# Patient Record
Sex: Male | Born: 1980 | Race: White | Hispanic: No | Marital: Single | Smoking: Never smoker
Health system: Southern US, Community
[De-identification: ages and names within clinical notes are randomized; demographics above are authoritative.]

## PROBLEM LIST (undated history)

## (undated) DIAGNOSIS — J45909 Unspecified asthma, uncomplicated: Secondary | ICD-10-CM

## (undated) HISTORY — PX: HERNIA REPAIR: SHX51

## (undated) HISTORY — PX: TONSILLECTOMY: SUR1361

---

## 2008-10-06 ENCOUNTER — Emergency Department (HOSPITAL_BASED_OUTPATIENT_CLINIC_OR_DEPARTMENT_OTHER): Admission: EM | Admit: 2008-10-06 | Discharge: 2008-10-06 | Payer: Self-pay | Admitting: Emergency Medicine

## 2008-10-07 ENCOUNTER — Emergency Department (HOSPITAL_BASED_OUTPATIENT_CLINIC_OR_DEPARTMENT_OTHER): Admission: EM | Admit: 2008-10-07 | Discharge: 2008-10-07 | Payer: Self-pay | Admitting: Emergency Medicine

## 2012-10-07 ENCOUNTER — Ambulatory Visit: Payer: Self-pay | Admitting: Physician Assistant

## 2013-03-30 ENCOUNTER — Ambulatory Visit: Payer: Self-pay

## 2013-03-30 LAB — RAPID INFLUENZA A&B ANTIGENS

## 2013-04-02 ENCOUNTER — Ambulatory Visit: Payer: Self-pay

## 2013-04-02 LAB — CBC WITH DIFFERENTIAL/PLATELET
Basophil %: 0.4 %
Eosinophil %: 7 %
HGB: 17.4 g/dL (ref 13.0–18.0)
Lymphocyte %: 31.1 %
MCHC: 33.3 g/dL (ref 32.0–36.0)
MCV: 86 fL (ref 80–100)
Monocyte #: 0.6 x10 3/mm (ref 0.2–1.0)
Monocyte %: 7.8 %
Platelet: 208 10*3/uL (ref 150–440)
WBC: 7.2 10*3/uL (ref 3.8–10.6)

## 2013-04-02 LAB — COMPREHENSIVE METABOLIC PANEL
Alkaline Phosphatase: 66 U/L (ref 50–136)
Anion Gap: 12 (ref 7–16)
BUN: 34 mg/dL — ABNORMAL HIGH (ref 7–18)
Bilirubin,Total: 0.5 mg/dL (ref 0.2–1.0)
Chloride: 102 mmol/L (ref 98–107)
Co2: 26 mmol/L (ref 21–32)
Creatinine: 1.28 mg/dL (ref 0.60–1.30)
EGFR (African American): >60
EGFR (Non-African Amer.): >60
Potassium: 4.4 mmol/L (ref 3.5–5.1)
SGOT(AST): 19 U/L (ref 15–37)
SGPT (ALT): 35 U/L (ref 12–78)
Sodium: 140 mmol/L (ref 136–145)

## 2013-04-02 LAB — MONONUCLEOSIS SCREEN: Mono Test: NEGATIVE

## 2014-09-29 IMAGING — CR DG CHEST 2V
1 series · 3 of 3 positions shown · non-contrast
Comparison: none

REASON FOR EXAM: wheezing
COMMENTS:

PROCEDURE:     MDR - MDR CHEST PA(OR AP) AND LATERAL  - April 02, 2013  [DATE]
RESULT:     The lungs are clear. The cardiac silhouette and visualized bony
skeleton are unremarkable.

[Series 1: pa · 0.17mm/px · 3 of 3 slices shown]
[im 1/3]
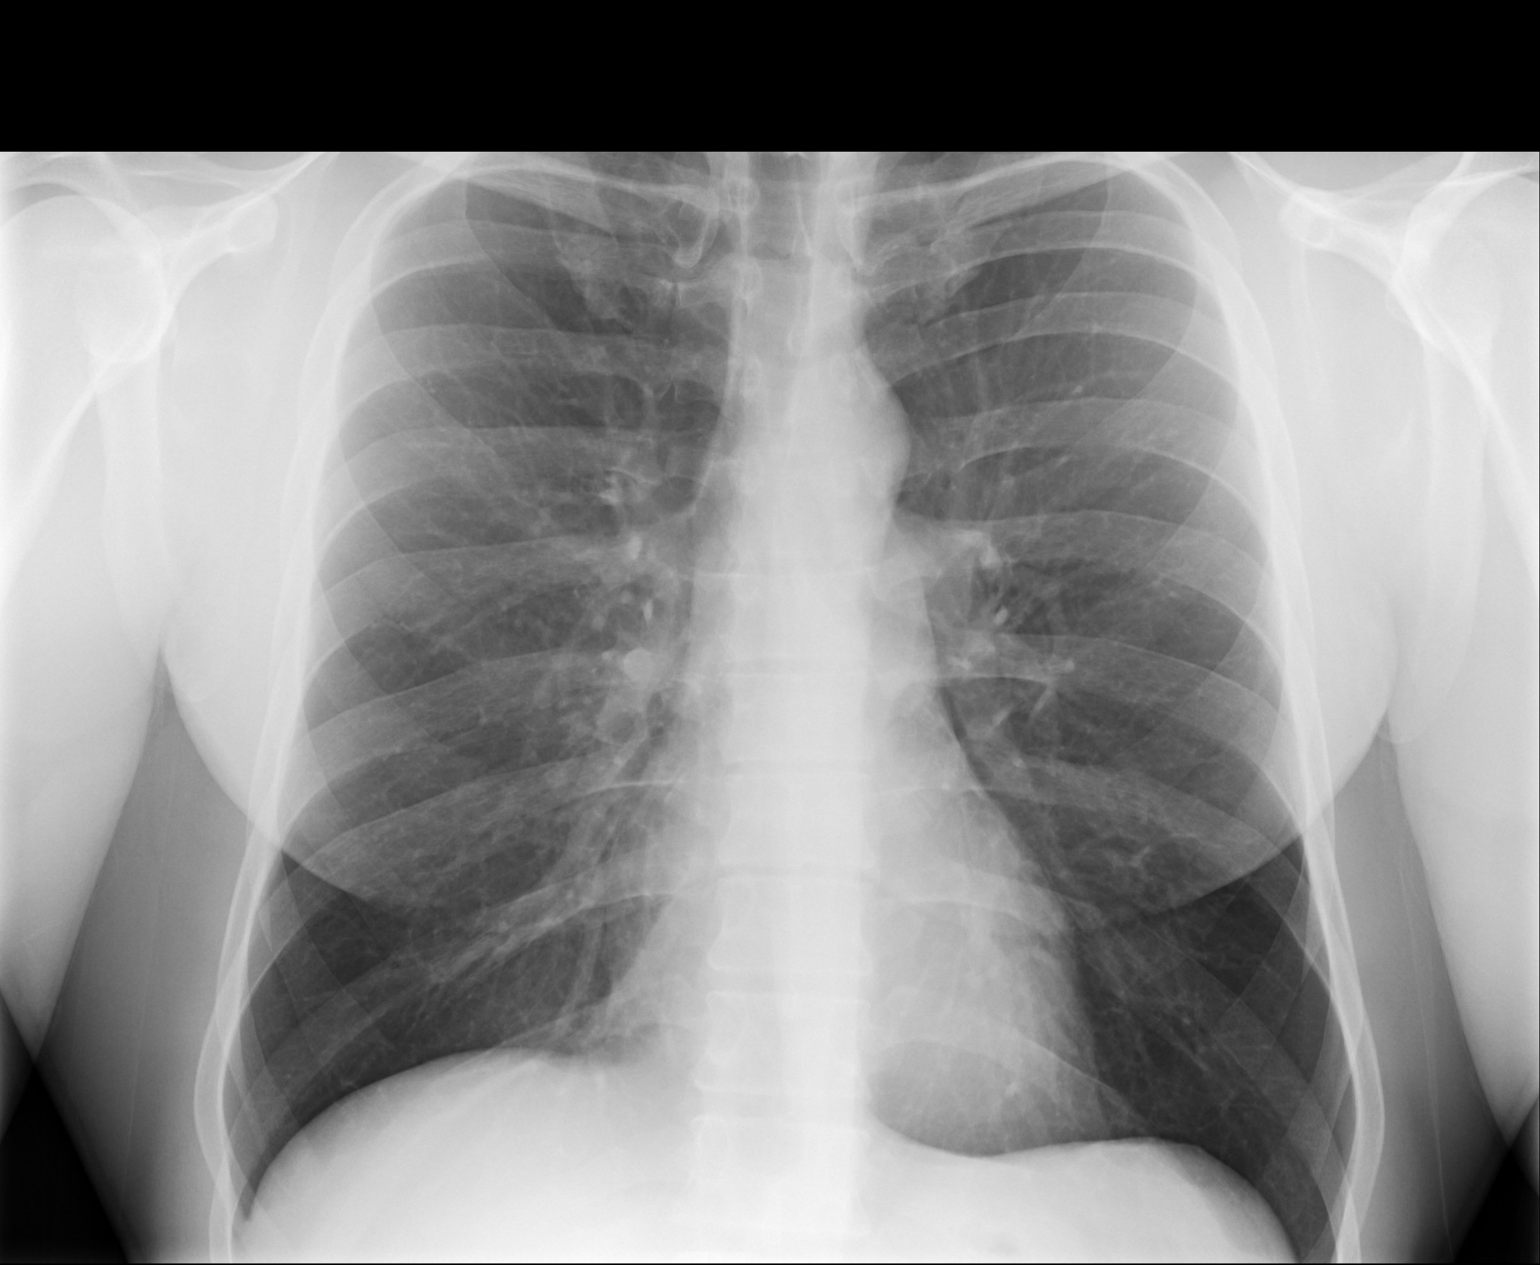
[im 2/3]
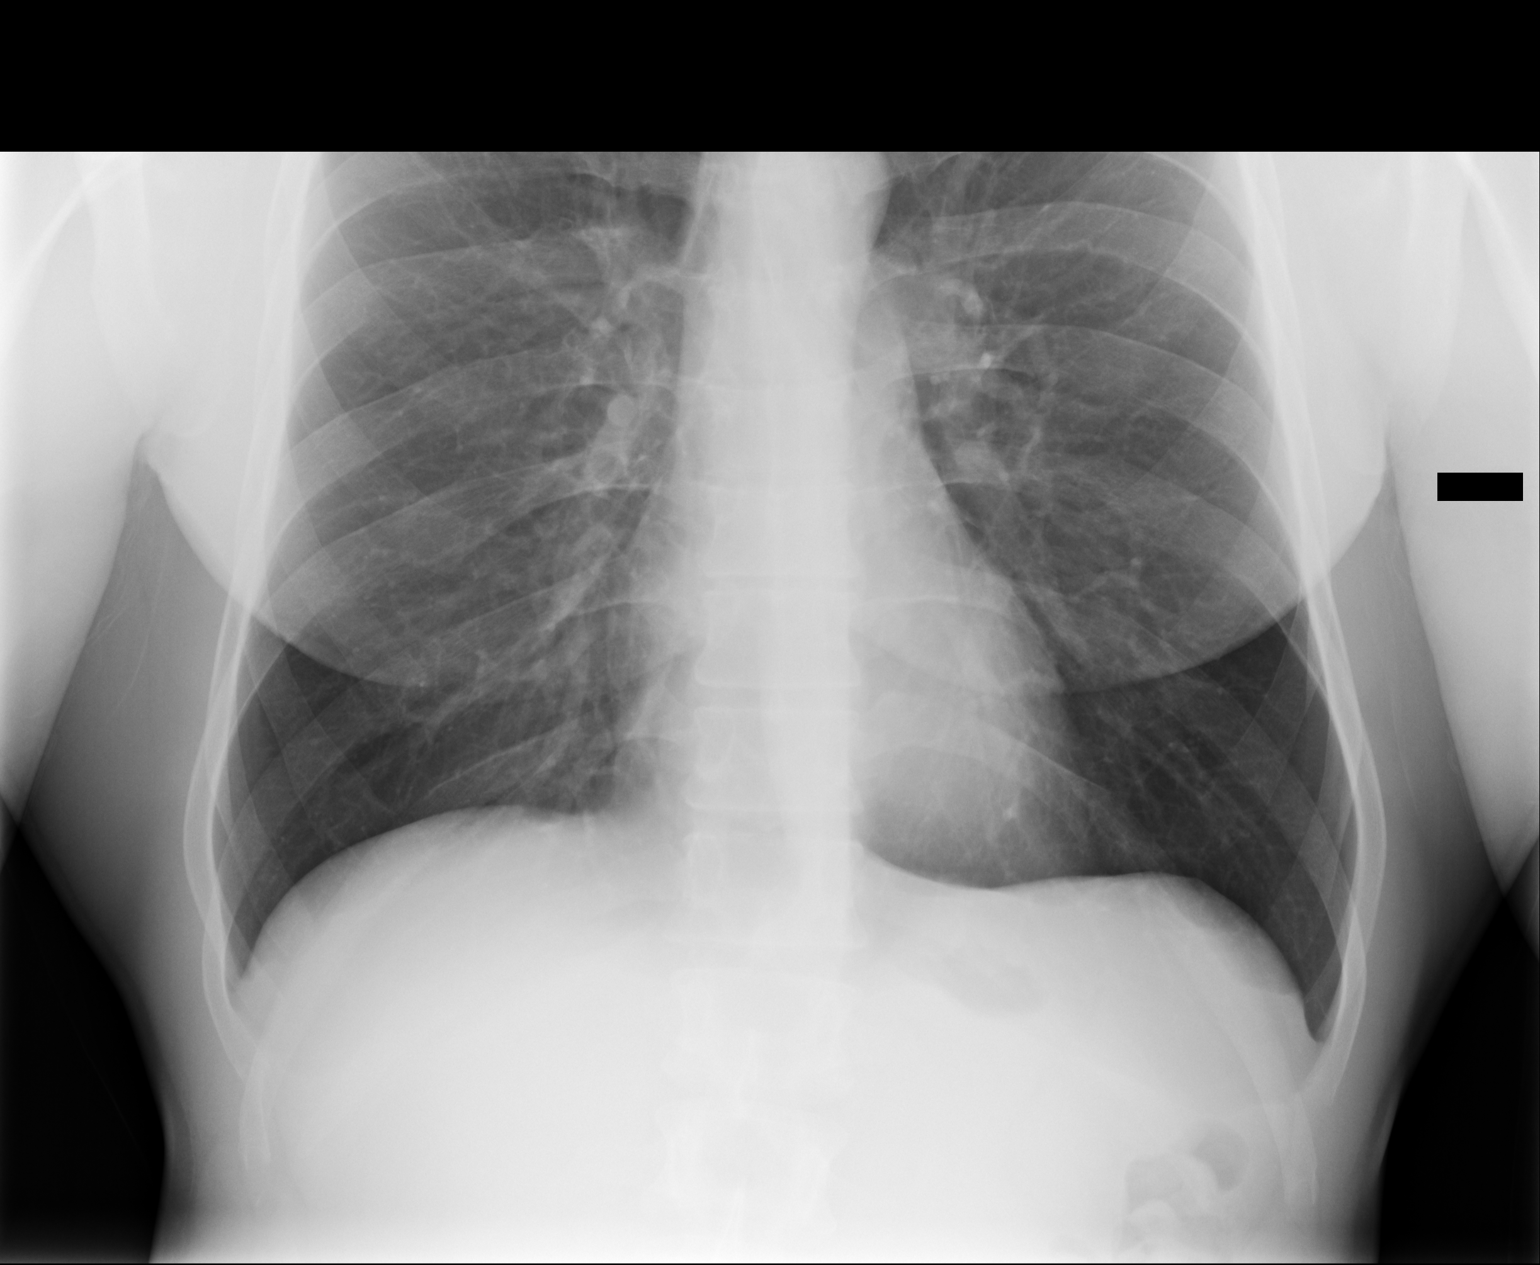
[im 3/3]
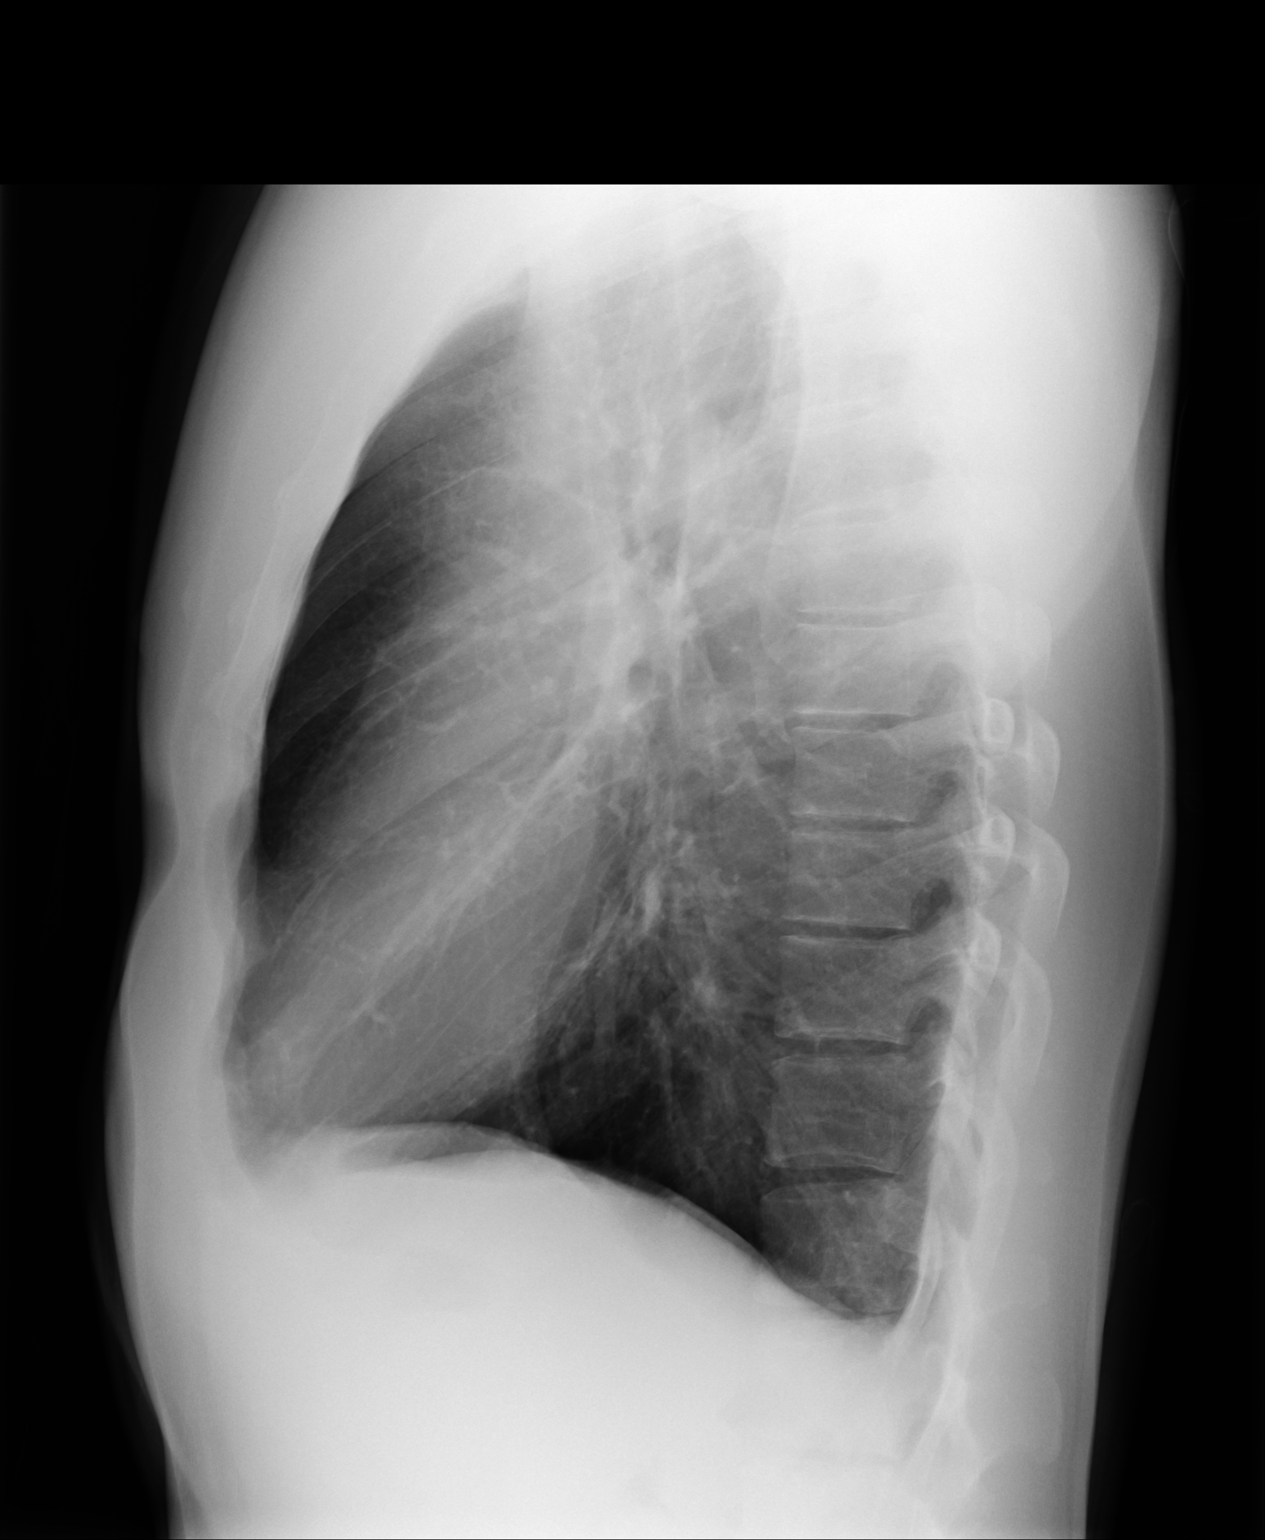

[3 of 3 positions shown; findings below may reference images not displayed]

IMPRESSION: 1. Chest radiograph without evidence of acute cardiopulmonary disease.
2. Comparison to prior dated 03/30/2013.

## 2019-02-07 ENCOUNTER — Other Ambulatory Visit: Payer: Self-pay

## 2019-02-07 ENCOUNTER — Emergency Department (HOSPITAL_BASED_OUTPATIENT_CLINIC_OR_DEPARTMENT_OTHER): Payer: 59

## 2019-02-07 ENCOUNTER — Encounter (HOSPITAL_BASED_OUTPATIENT_CLINIC_OR_DEPARTMENT_OTHER): Payer: Self-pay | Admitting: Emergency Medicine

## 2019-02-07 ENCOUNTER — Emergency Department (HOSPITAL_BASED_OUTPATIENT_CLINIC_OR_DEPARTMENT_OTHER)
Admission: EM | Admit: 2019-02-07 | Discharge: 2019-02-07 | Disposition: A | Discharge status: Home / Self Care | Payer: 59 | Attending: Emergency Medicine | Admitting: Emergency Medicine

## 2019-02-07 DIAGNOSIS — Z20822 Contact with and (suspected) exposure to covid-19: Secondary | ICD-10-CM

## 2019-02-07 DIAGNOSIS — Z20828 Contact with and (suspected) exposure to other viral communicable diseases: Secondary | ICD-10-CM | POA: Diagnosis not present

## 2019-02-07 DIAGNOSIS — M7918 Myalgia, other site: Secondary | ICD-10-CM | POA: Diagnosis not present

## 2019-02-07 DIAGNOSIS — R0602 Shortness of breath: Secondary | ICD-10-CM | POA: Diagnosis present

## 2019-02-07 DIAGNOSIS — J45909 Unspecified asthma, uncomplicated: Secondary | ICD-10-CM | POA: Diagnosis not present

## 2019-02-07 DIAGNOSIS — R52 Pain, unspecified: Secondary | ICD-10-CM

## 2019-02-07 HISTORY — DX: Unspecified asthma, uncomplicated: J45.909

## 2019-02-07 NOTE — ED Provider Notes (Signed)
[facility] EMERGENCY DEPARTMENT Provider Note   CSN: [id number] Arrival date & time: 02/07/19  0101     History   Chief Complaint Chief Complaint  Patient presents with  . Shortness of Breath    HPI Daniel Barrett is a 38 y.o. male.     The history is provided by the patient.  Shortness of Breath Severity:  Mild Onset quality:  Gradual Duration:  1 week Timing:  Constant Progression:  Unchanged Chronicity:  New Context: not URI   Context comment:  Was hanging out with 5 people that have COVID.   Relieved by:  Nothing Worsened by:  Nothing Ineffective treatments:  Inhaler Associated symptoms: no chest pain, no cough, no diaphoresis, no fever, no hemoptysis, no neck pain, no rash, no sore throat, no sputum production and no wheezing   Associated symptoms comment:  Body aches Risk factors: no hx of PE/DVT   Patient with no PMH or significant FMH who presents with sensation of SOB after spending time with multiple covid positive people.  No CP, no DOE, no leg pain or swelling.  No travel.  No anosmia or loss of taste but has spent time with multiple covid positive people.  He had a rapid test Monday that was negative.    Past Medical History:  Diagnosis Date  . Asthma     There are no active problems to display for this patient.    Home Medications    Prior to Admission medications   Not on File    Family History No family history on file.  Social History Social History   Tobacco Use  . Smoking status: Never Smoker  . Smokeless tobacco: Never Used  Substance Use Topics  . Alcohol use: Not Currently  . Drug use: Not on file     Allergies   Sulfa antibiotics   Review of Systems Review of Systems  Constitutional: Negative for diaphoresis and fever.  HENT: Negative for sore throat.   Eyes: Negative for visual disturbance.  Respiratory: Positive for shortness of breath. Negative for cough, hemoptysis, sputum production and wheezing.    Cardiovascular: Negative for chest pain, palpitations and leg swelling.  Genitourinary: Negative for dysuria.  Musculoskeletal: Positive for myalgias. Negative for arthralgias, joint swelling, neck pain and neck stiffness.  Skin: Negative for rash.  Neurological: Negative for numbness.  Psychiatric/Behavioral: The patient is nervous/anxious.   All other systems reviewed and are negative.    Physical Exam Updated Vital Signs BP (!) 161/76 (BP Location: Right Arm)   Pulse 69   Temp 98.3 F (36.8 C) (Oral)   Resp 16   Ht 5\' 11"  (1.803 m)   Wt 99.8 kg   SpO2 100%   BMI 30.68 kg/m   Physical Exam Vitals signs and nursing note reviewed.  Constitutional:      General: He is not in acute distress.    Appearance: He is normal weight.  HENT:     Head: Normocephalic and atraumatic.     Nose: Nose normal.  Eyes:     Conjunctiva/sclera: Conjunctivae normal.     Pupils: Pupils are equal, round, and reactive to light.  Neck:     Musculoskeletal: Normal range of motion and neck supple.  Cardiovascular:     Rate and Rhythm: Normal rate and regular rhythm.     Pulses: Normal pulses.     Heart sounds: Normal heart sounds.  Pulmonary:     Effort: Pulmonary effort is normal. No respiratory distress.  Breath sounds: Normal breath sounds. No wheezing or rales.  Abdominal:     General: Abdomen is flat. Bowel sounds are normal.     Tenderness: There is no abdominal tenderness. There is no guarding or rebound.  Musculoskeletal: Normal range of motion.        General: No swelling or tenderness.     Right lower leg: No edema.     Left lower leg: No edema.  Skin:    General: Skin is warm and dry.     Capillary Refill: Capillary refill takes less than 2 seconds.  Neurological:     General: No focal deficit present.     Mental Status: He is alert and oriented to person, place, and time.  Psychiatric:        Mood and Affect: Mood is anxious.      ED Treatments / Results  Labs (all  labs ordered are listed, but only abnormal results are displayed) Labs Reviewed  NOVEL CORONAVIRUS, NAA (HOSPITAL ORDER, SEND-OUT TO REF LAB)     Radiology No results found for this or any previous visit. Dg Chest Portable 1 View  Result Date: 02/07/2019 CLINICAL DATA:  Shortness of breath for several days EXAM: PORTABLE CHEST 1 VIEW COMPARISON:  04/02/2013 FINDINGS: Cardiac shadows within normal limits. The lungs are well aerated bilaterally. No focal infiltrate or sizable effusion is noted. No bony abnormality is seen. IMPRESSION: No acute abnormality noted. Electronically Signed   By: Alcide CleverMark  Lukens M.D.   On: 02/07/2019 02:04     Procedures Procedures (including critical care time)  Medications Ordered in ED Medications - No data to display   PERC negative wells 0 highly doubt PE in this low risk patient.  I think this is anxiety more than anything but I have repeated the covid test and placed the patient on STRICT home isolation until the test results in 10 days.  He is not to leave the house at all except for worsening shortness of breath weakness or chest pain.  He verbalizes understanding and agrees to quarantine.  Isolation paperwork has been signed.    Daniel Barrett was evaluated in Emergency Department on 02/07/2019 for the symptoms described in the history of present illness. He was evaluated in the context of the global COVID-19 pandemic, which necessitated consideration that the patient might be at risk for infection with the SARS-CoV-2 virus that causes COVID-19. Institutional protocols and algorithms that pertain to the evaluation of patients at risk for COVID-19 are in a state of rapid change based on information released by regulatory bodies including the CDC and federal and state organizations. These policies and algorithms were followed during the patient's care in the ED.   Final Clinical Impressions(s) / ED Diagnoses   Return for intractable cough, coughing up  blood,fevers >100.4 unrelieved by medication, shortness of breath, intractable vomiting, chest pain, shortness of breath, weakness,numbness, changes in speech, facial asymmetry,abdominal pain, passing out,Inability to tolerate liquids or food, cough, altered mental status or any concerns. No signs of systemic illness or infection. The patient is nontoxic-appearing on exam and vital signs are within normal limits.   I have reviewed the triage vital signs and the nursing notes. Pertinent labs &imaging results that were available during my care of the patient were reviewed by me and considered in my medical decision making (see chart for details).  After history, exam, and medical workup I feel the patient has been appropriately medically screened and is safe for discharge home. Pertinent  diagnoses were discussed with the patient. Patient was given return precautions   Renaldo Gornick, MD 02/07/19 1660

## 2019-02-07 NOTE — Discharge Instructions (Addendum)
Person Under Monitoring Name: Daniel Barrett  Location: [place] [address]   Infection Prevention Recommendations for Individuals Confirmed to have, or Being Evaluated for, 2019 Novel Coronavirus (COVID-19) Infection Who Receive Care at Home  Individuals who are confirmed to have, or are being evaluated for, COVID-19 should follow the prevention steps below until a healthcare provider or local or state health department says they can return to normal activities.  Stay home except to get medical care You should restrict activities outside your home, except for getting medical care. Do not go to work, school, or public areas, and do not use public transportation or taxis.  Call ahead before visiting your doctor Before your medical appointment, call the healthcare provider and tell them that you have, or are being evaluated for, COVID-19 infection. This will help the healthcare providers office take steps to keep other people from getting infected. Ask your healthcare provider to call the local or state health department.  Monitor your symptoms Seek prompt medical attention if your illness is worsening (e.g., difficulty breathing). Before going to your medical appointment, call the healthcare provider and tell them that you have, or are being evaluated for, COVID-19 infection. Ask your healthcare provider to call the local or state health department.  Wear a facemask You should wear a facemask that covers your nose and mouth when you are in the same room with other people and when you visit a healthcare provider. People who live with or visit you should also wear a facemask while they are in the same room with you.  Separate yourself from other people in your home As much as possible, you should stay in a different room from other people in your home. Also, you should use a separate bathroom, if available.  Avoid sharing household items You should not  share dishes, drinking glasses, cups, eating utensils, towels, bedding, or other items with other people in your home. After using these items, you should wash them thoroughly with soap and water.  Cover your coughs and sneezes Cover your mouth and nose with a tissue when you cough or sneeze, or you can cough or sneeze into your sleeve. Throw used tissues in a lined trash can, and immediately wash your hands with soap and water for at least 20 seconds or use an alcohol-based hand rub.  Wash your [facility] your hands often and thoroughly with soap and water for at least 20 seconds. You can use an alcohol-based hand sanitizer if soap and water are not available and if your hands are not visibly dirty. Avoid touching your eyes, nose, and mouth with unwashed hands.   Prevention Steps for Caregivers and Household Members of Individuals Confirmed to have, or Being Evaluated for, COVID-19 Infection Being Cared for in the Home  If you live with, or provide care at home for, a person confirmed to have, or being evaluated for, COVID-19 infection please follow these guidelines to prevent infection:  Follow healthcare providers instructions Make sure that you understand and can help the patient follow any healthcare provider instructions for all care.  Provide for the patients basic needs You should help the patient with basic needs in the home and provide support for getting groceries, prescriptions, and other personal needs.  Monitor the patients symptoms If they are getting sicker, call his or her medical provider and tell them that the patient has, or is being evaluated for, COVID-19 infection. This will help the healthcare providers office  take steps to keep other people from getting infected. Ask the healthcare provider to call the local or state health department.  Limit the number of people who have contact with the patient If possible, have only one caregiver for the  patient. Other household members should stay in another home or place of residence. If this is not possible, they should stay in another room, or be separated from the patient as much as possible. Use a separate bathroom, if available. Restrict visitors who do not have an essential need to be in the home.  Keep older adults, very young children, and other sick people away from the patient Keep older adults, very young children, and those who have compromised immune systems or chronic health conditions away from the patient. This includes people with chronic heart, lung, or kidney conditions, diabetes, and cancer.  Ensure good ventilation Make sure that shared spaces in the home have good air flow, such as from an air conditioner or an opened window, weather permitting.  Wash your hands often Wash your hands often and thoroughly with soap and water for at least 20 seconds. You can use an alcohol based hand sanitizer if soap and water are not available and if your hands are not visibly dirty. Avoid touching your eyes, nose, and mouth with unwashed hands. Use disposable paper towels to dry your hands. If not available, use dedicated cloth towels and replace them when they become wet.  Wear a facemask and gloves Wear a disposable facemask at all times in the room and gloves when you touch or have contact with the patients blood, body fluids, and/or secretions or excretions, such as sweat, saliva, sputum, nasal mucus, vomit, urine, or feces.  Ensure the mask fits over your nose and mouth tightly, and do not touch it during use. Throw out disposable facemasks and gloves after using them. Do not reuse. Wash your hands immediately after removing your facemask and gloves. If your personal clothing becomes contaminated, carefully remove clothing and launder. Wash your hands after handling contaminated clothing. Place all used disposable facemasks, gloves, and other waste in a lined container before  disposing them with other household waste. Remove gloves and wash your hands immediately after handling these items.  Do not share dishes, glasses, or other household items with the patient Avoid sharing household items. You should not share dishes, drinking glasses, cups, eating utensils, towels, bedding, or other items with a patient who is confirmed to have, or being evaluated for, COVID-19 infection. After the person uses these items, you should wash them thoroughly with soap and water.  Wash laundry thoroughly Immediately remove and wash clothes or bedding that have blood, body fluids, and/or secretions or excretions, such as sweat, saliva, sputum, nasal mucus, vomit, urine, or feces, on them. Wear gloves when handling laundry from the patient. Read and follow directions on labels of laundry or clothing items and detergent. In general, wash and dry with the warmest temperatures recommended on the label.  Clean all areas the individual has used often Clean all touchable surfaces, such as counters, tabletops, doorknobs, bathroom fixtures, toilets, phones, keyboards, tablets, and bedside tables, every day. Also, clean any surfaces that may have blood, body fluids, and/or secretions or excretions on them. Wear gloves when cleaning surfaces the patient has come in contact with. Use a diluted bleach solution (e.g., dilute bleach with 1 part bleach and 10 parts water) or a household disinfectant with a label that says EPA-registered for coronaviruses. To make a bleach  solution at home, add 1 tablespoon of bleach to 1 quart (4 cups) of water. For a larger supply, add  cup of bleach to 1 gallon (16 cups) of water. Read labels of cleaning products and follow recommendations provided on product labels. Labels contain instructions for safe and effective use of the cleaning product including precautions you should take when applying the product, such as wearing gloves or eye protection and making sure you  have good ventilation during use of the product. Remove gloves and wash hands immediately after cleaning.  Monitor yourself for signs and symptoms of illness Caregivers and household members are considered close contacts, should monitor their health, and will be asked to limit movement outside of the home to the extent possible. Follow the monitoring steps for close contacts listed on the symptom monitoring form.   ? If you have additional questions, contact your local health department or call the epidemiologist on call at [phone] (available 24/7). ? This guidance is subject to change. For the most up-to-date guidance from [facility], please refer to their website: [email or web address]

## 2019-02-07 NOTE — ED Triage Notes (Signed)
Pt reports shob x 2-3 days. Pt reports exposure to COVID on 01/26/2019. Pt tested negative for COVID this past mon.

## 2019-02-08 LAB — NOVEL CORONAVIRUS, NAA (HOSP ORDER, SEND-OUT TO REF LAB; TAT 18-24 HRS): SARS-CoV-2, NAA: NOT DETECTED

## 2019-10-30 ENCOUNTER — Encounter (HOSPITAL_BASED_OUTPATIENT_CLINIC_OR_DEPARTMENT_OTHER): Payer: Self-pay | Admitting: *Deleted

## 2019-10-30 ENCOUNTER — Other Ambulatory Visit: Payer: Self-pay

## 2019-10-30 ENCOUNTER — Emergency Department (HOSPITAL_BASED_OUTPATIENT_CLINIC_OR_DEPARTMENT_OTHER)
Admission: EM | Admit: 2019-10-30 | Discharge: 2019-10-30 | Disposition: A | Discharge status: Home / Self Care | Payer: Self-pay | Attending: Emergency Medicine | Admitting: Emergency Medicine

## 2019-10-30 ENCOUNTER — Emergency Department (HOSPITAL_BASED_OUTPATIENT_CLINIC_OR_DEPARTMENT_OTHER): Payer: Self-pay

## 2019-10-30 DIAGNOSIS — Z5321 Procedure and treatment not carried out due to patient leaving prior to being seen by health care provider: Secondary | ICD-10-CM | POA: Insufficient documentation

## 2019-10-30 DIAGNOSIS — M25511 Pain in right shoulder: Secondary | ICD-10-CM | POA: Insufficient documentation

## 2019-10-30 NOTE — ED Triage Notes (Signed)
PT reports right shoulder pain since last night while doing a bench press. States that his shoulder got hot, denies a 'pop'. Pulses intact. Inability to raise arm today. Tender on palpation of anterior shoulder.

## 2020-08-05 IMAGING — DX PORTABLE CHEST - 1 VIEW
2 series · 2 of 2 positions shown · non-contrast
Comparison: 04/02/2013

CLINICAL DATA: Shortness of breath for several days

EXAM:
PORTABLE CHEST 1 VIEW

[chest ap (1 of 2)]
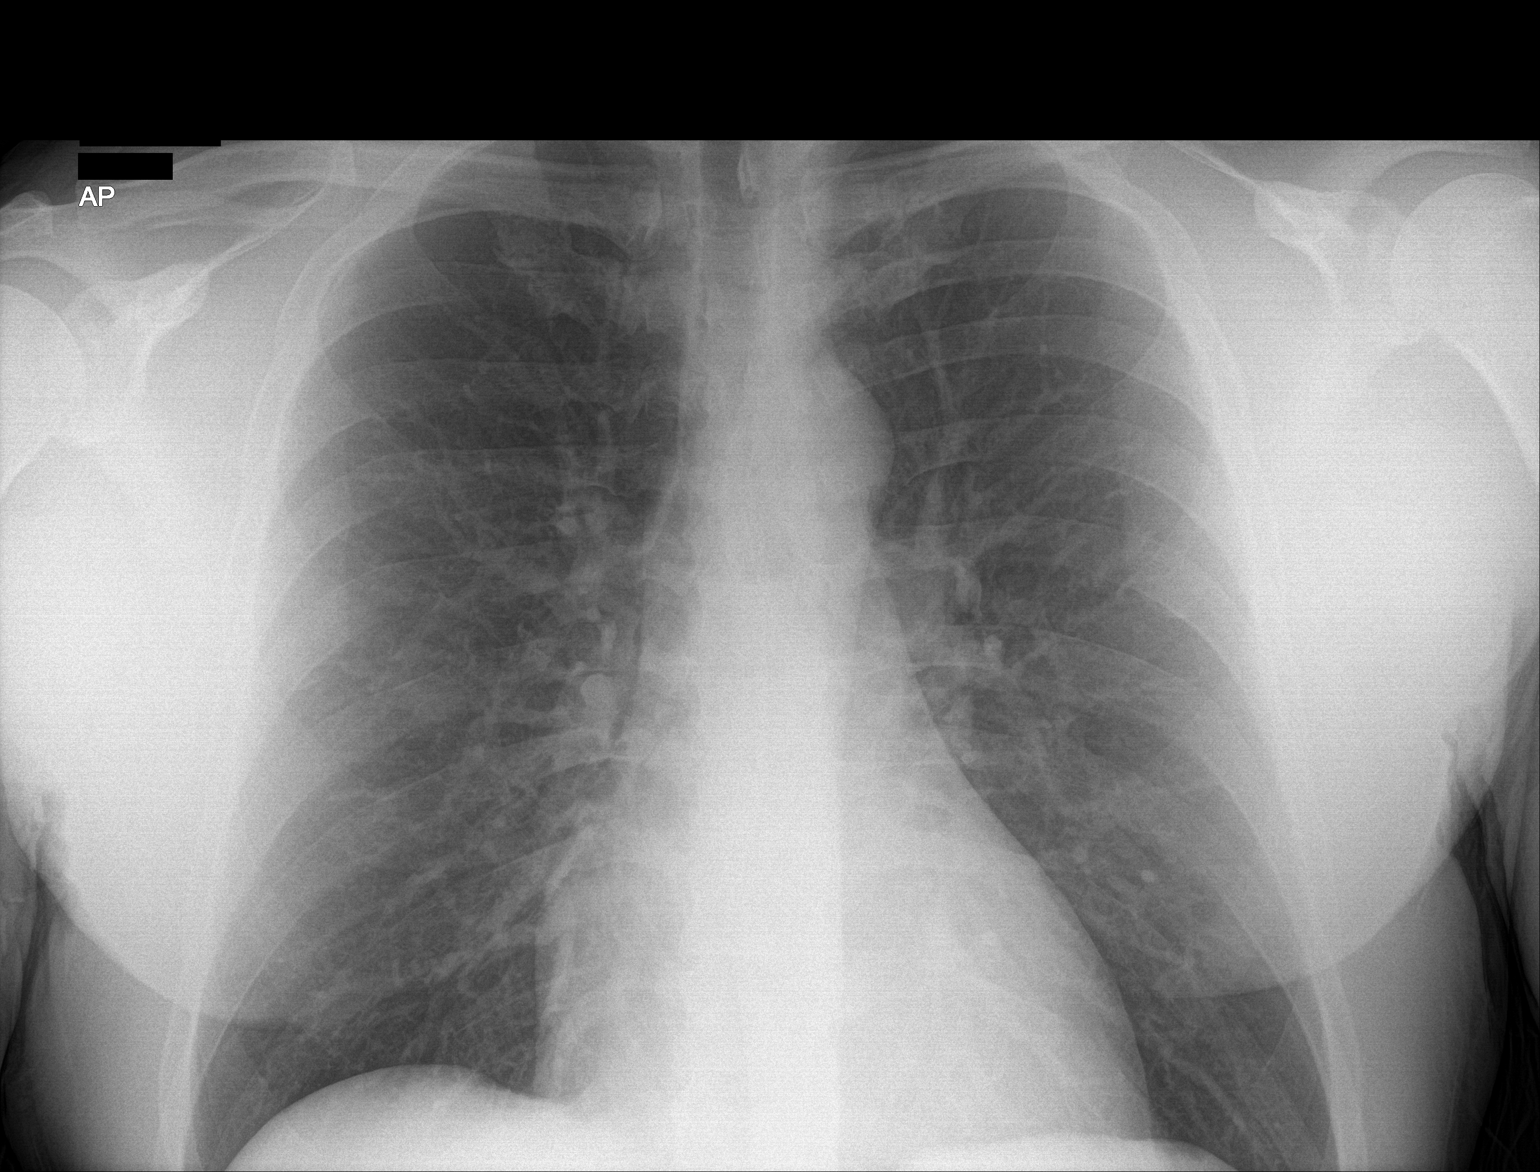

[chest ap (2 of 2)]
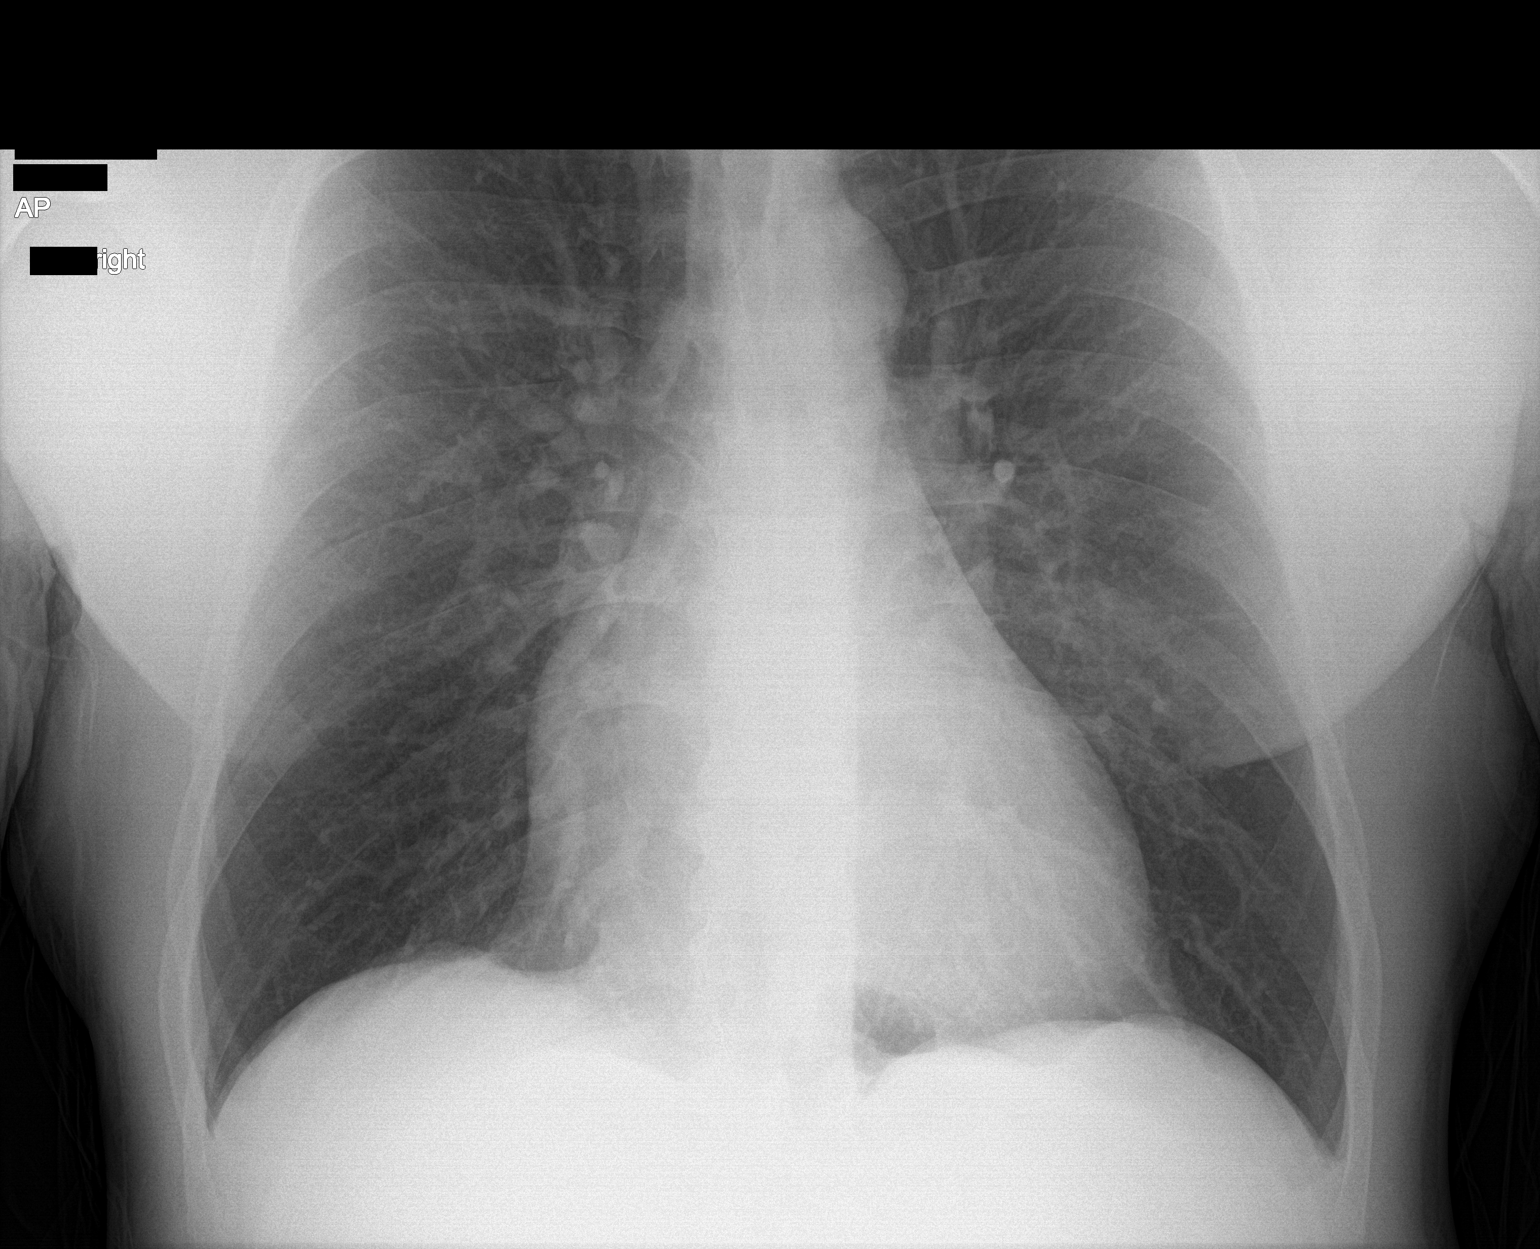

[2 of 2 positions shown; findings below may reference images not displayed]

FINDINGS: Cardiac shadows within normal limits. The lungs are well aerated
bilaterally. No focal infiltrate or sizable effusion is noted. No
bony abnormality is seen.
IMPRESSION: No acute abnormality noted.

## 2021-04-01 ENCOUNTER — Ambulatory Visit (INDEPENDENT_AMBULATORY_CARE_PROVIDER_SITE_OTHER): Payer: Self-pay | Admitting: Internal Medicine

## 2021-04-01 ENCOUNTER — Encounter: Payer: Self-pay | Admitting: Internal Medicine

## 2021-04-01 ENCOUNTER — Other Ambulatory Visit: Payer: Self-pay

## 2021-04-01 DIAGNOSIS — J90 Pleural effusion, not elsewhere classified: Secondary | ICD-10-CM

## 2021-04-01 NOTE — Progress Notes (Signed)
Daniel Barrett, male    DOB: 1980-11-13,  MRN: [id number]   Brief patient profile:  83 yowm never smoker referred to pulmonary clinic 04/01/2021 by  Natale Lay NP in [place]   Asthma as child, saba age 40 eval at [facility] declined injections due to travel, used saba before ex thru HS advair helped some, Breo 200 and fine s need for albuterol / also maintain on allegra and doing fine until July 25th 2022 went to [place] R sided pleurtic cp much better lying down initially.  Since 02/01/21 gradually worse and late august could no longer lie down > seen  03/03/21  dx pna/ effusion  rx prednisone helped some for a few days / 2 rounds abx/   History of Present Illness  04/01/2021  Pulmonary/ 1st office eval/Ceri Mayer  Chief Complaint  Patient presents with   Pulmonary Consult    Pt c/o SOB for the past 2 months- started with right side CP upon inspiration. He states he feels SOB "always"- states he has had asthma all his life. He was winded walking from lobby to exam room today. He has been using his albuterol inhaler 8-10 times per day.   Dyspnea:  walk ok flat, steps ok  Cough: dry /worse when lie back assoc R cp  Sleep: on sofa, corner of section so mostly upright  SABA use: doesn't help much   No obvious day to day or daytime variability or assoc excess/ purulent sputum or mucus plugs or hemoptysis or  chest tightness or overt sinus or hb symptoms.    Also denies any obvious fluctuation of symptoms with weather or environmental changes or other aggravating or alleviating factors except as outlined above   No unusual exposure hx or h/o childhood pna/ asthma or knowledge of premature birth.  Current Allergies, Complete Past Medical History, Past Surgical History, Family History, and Social History were reviewed in [employer] record.  ROS  The following are not active complaints unless bolded Hoarseness, sore throat, dysphagia, dental problems, itching, sneezing,  nasal congestion  or discharge of excess mucus or purulent secretions, ear ache,   fever, chills, sweats, unintended wt loss or wt gain, classically  exertional cp,  orthopnea pnd or arm/hand swelling  or leg swelling, presyncope, palpitations, abdominal pain, anorexia, nausea, vomiting, diarrhea  or change in bowel habits or change in bladder habits, change in stools or change in urine, dysuria, hematuria,  rash, arthralgias, visual complaints, headache, numbness, weakness or ataxia or problems with walking or coordination,  change in mood or  memory.           Past Medical History:  Diagnosis Date   Asthma     Outpatient Medications Prior to Visit  Medication Sig Dispense Refill   albuterol (PROAIR HFA) 108 (90 Base) MCG/ACT inhaler Inhale 2 puffs into the lungs every 6 (six) hours as needed for wheezing or shortness of breath.     fexofenadine (ALLEGRA) 180 MG tablet Take 180 mg by mouth daily.     fluticasone furoate-vilanterol (BREO ELLIPTA) 200-25 MCG/INH AEPB Inhale 1 puff into the lungs daily.     No facility-administered medications prior to visit.     Objective:     BP 126/84 (BP Location: Left Arm, Cuff Size: Normal)   Pulse 85   Temp 98.2 F (36.8 C) (Oral)   Ht 5\' 11"  (1.803 m)   Wt 214 lb (97.1 kg)   SpO2 95% Comment: on RA  BMI 29.85 kg/m  SpO2: 95 % (on RA)  Amb wm nad    HEENT : pt wearing mask not removed for exam due to covid -19 concerns.    NECK :  without JVD/Nodes/TM/ nl carotid upstrokes bilaterally   LUNGS: no acc muscle use,  Nl contour chest with decreased bs/ dullness R 1/3  without cough on insp or exp maneuvers   CV:  RRR  no s3 or murmur or increase in P2, and no edema   ABD:  soft and nontender with nl inspiratory excursion in the supine position. No bruits or organomegaly appreciated, bowel sounds nl  MS:  Nl gait/ ext warm without deformities, calf tenderness, cyanosis or clubbing No obvious joint restrictions   SKIN: warm and dry without lesions     NEURO:  alert, approp, nl sensorium with  no motor or cerebellar deficits apparent.        I personally reviewed images and agree with radiology impression as follows:   Chest CT w/o contrast 03/23/21 1.  Moderate size right pleural effusion  2.  Right lower lobe infiltrate or atelectasis  3.  Focal right middle lobe atelectasis  4.  Bilateral lung nodules all measuring 4 mm. See recommendations below     Assessment   No problem-specific Assessment & Plan notes found for this encounter.     Sandrea Hughs, MD 04/01/2021

## 2021-04-01 NOTE — Patient Instructions (Signed)
You can take advil with meals as need for pain  (200 mg x 3 with each meals)  We call to schedule R sided thoracentesis asap   - if condition worsens while waiting go [facility]

## 2021-04-02 ENCOUNTER — Encounter: Payer: Self-pay | Admitting: Internal Medicine

## 2021-04-02 NOTE — Assessment & Plan Note (Addendum)
Onset July 25th 2022 - [place] Tcentesis  Most likely this is a late parapneumonic effusion assoc with "relaxation atx" rather than an obtructed airway in this never smoker.  PE would have been a concern early in his course due to occurrence p car trip to [place] but he has no risk factors and main problem now is the  Effusion. Malignancy also in ddx given the amt of pain he's experiencing but no risk factors for this either.  Rec: T centesis asap for therapeutic and dx purposes, perhaps with repeat cxr p procedure.  In meantime rec advil /judicious use of saba as I doubt they are of any benefit here.  BREO 200 needs to be reconsidered in pt with probable pna as cause for this present dilemma as this high a dose of ICS may risk pna.   Discussed in detail all the  indications, usual  risks and alternatives  relative to the benefits with patient who agrees to proceed with w/u as outlined.            Each maintenance medication was reviewed in detail including emphasizing most importantly the difference between maintenance and prns and under what circumstances the prns are to be triggered using an action plan format where appropriate.  Total time for H and P, chart review, counseling,  and generating customized AVS unique to this office visit / same day charting > 45 min

## 2021-04-05 ENCOUNTER — Other Ambulatory Visit: Payer: Self-pay

## 2021-04-05 ENCOUNTER — Other Ambulatory Visit: Payer: Self-pay | Admitting: Internal Medicine

## 2021-04-05 ENCOUNTER — Ambulatory Visit (HOSPITAL_COMMUNITY)
Admission: RE | Admit: 2021-04-05 | Discharge: 2021-04-05 | Disposition: A | Discharge status: Home / Self Care | Payer: BC Managed Care – PPO | Source: Ambulatory Visit | Attending: Internal Medicine | Admitting: Internal Medicine

## 2021-04-05 DIAGNOSIS — J90 Pleural effusion, not elsewhere classified: Secondary | ICD-10-CM

## 2021-04-05 HISTORY — PX: IR THORACENTESIS ASP PLEURAL SPACE W/IMG GUIDE: IMG5380

## 2021-04-05 LAB — BODY FLUID CELL COUNT WITH DIFFERENTIAL
Eos, Fluid: 9 %
Lymphs, Fluid: 57 %
Monocyte-Macrophage-Serous Fluid: 32 % — ABNORMAL LOW (ref 50–90)
Neutrophil Count, Fluid: 2 % (ref 0–25)
Total Nucleated Cell Count, Fluid: 623 cu mm (ref 0–1000)

## 2021-04-05 LAB — GRAM STAIN

## 2021-04-05 LAB — ALBUMIN, PLEURAL OR PERITONEAL FLUID: Albumin, Fluid: 2.7 g/dL

## 2021-04-05 LAB — PROTEIN, PLEURAL OR PERITONEAL FLUID: Total protein, fluid: 4.3 g/dL

## 2021-04-05 LAB — AMYLASE, PLEURAL OR PERITONEAL FLUID: Amylase, Fluid: 47 U/L

## 2021-04-05 LAB — GLUCOSE, PLEURAL OR PERITONEAL FLUID: Glucose, Fluid: 88 mg/dL

## 2021-04-05 LAB — LACTATE DEHYDROGENASE, PLEURAL OR PERITONEAL FLUID: LD, Fluid: 620 U/L — ABNORMAL HIGH (ref 3–23)

## 2021-04-05 MED ORDER — LIDOCAINE HCL 1 % IJ SOLN
INTRAMUSCULAR | Status: AC
  Filled 2021-04-05: qty 20

## 2021-04-05 NOTE — Procedures (Signed)
PROCEDURE SUMMARY:  Successful US guided right thoracentesis. Yielded 2 L of dark red fluid. Patient tolerated procedure well. No immediate complications. EBL = trace  Specimen was sent for labs.  Post procedure chest X-ray reveals no pneumothorax  Chundra Sauerwein S Priyah Schmuck PA-C 04/05/2021 12:46 PM

## 2021-04-06 LAB — CYTOLOGY - NON PAP

## 2021-04-06 LAB — TRIGLYCERIDES, BODY FLUIDS: Triglycerides, Fluid: 40 mg/dL

## 2021-04-07 LAB — ACID FAST SMEAR (AFB, MYCOBACTERIA): Acid Fast Smear: NEGATIVE

## 2021-04-08 NOTE — Progress Notes (Signed)
Called the pt- line rings and then turns busy tone

## 2021-04-10 LAB — CULTURE, BODY FLUID W GRAM STAIN -BOTTLE: Culture: NO GROWTH

## 2021-05-19 LAB — ACID FAST CULTURE WITH REFLEXED SENSITIVITIES (MYCOBACTERIA): Acid Fast Culture: NEGATIVE

## 2022-10-02 IMAGING — DX DG CHEST 1V
1 series · 1 of 1 positions shown · non-contrast
Comparison: One-view chest x-ray 02/07/2019

CLINICAL DATA: Status post right thoracentesis.  2 L removed.

EXAM:
CHEST  1 VIEW

[chest pa]
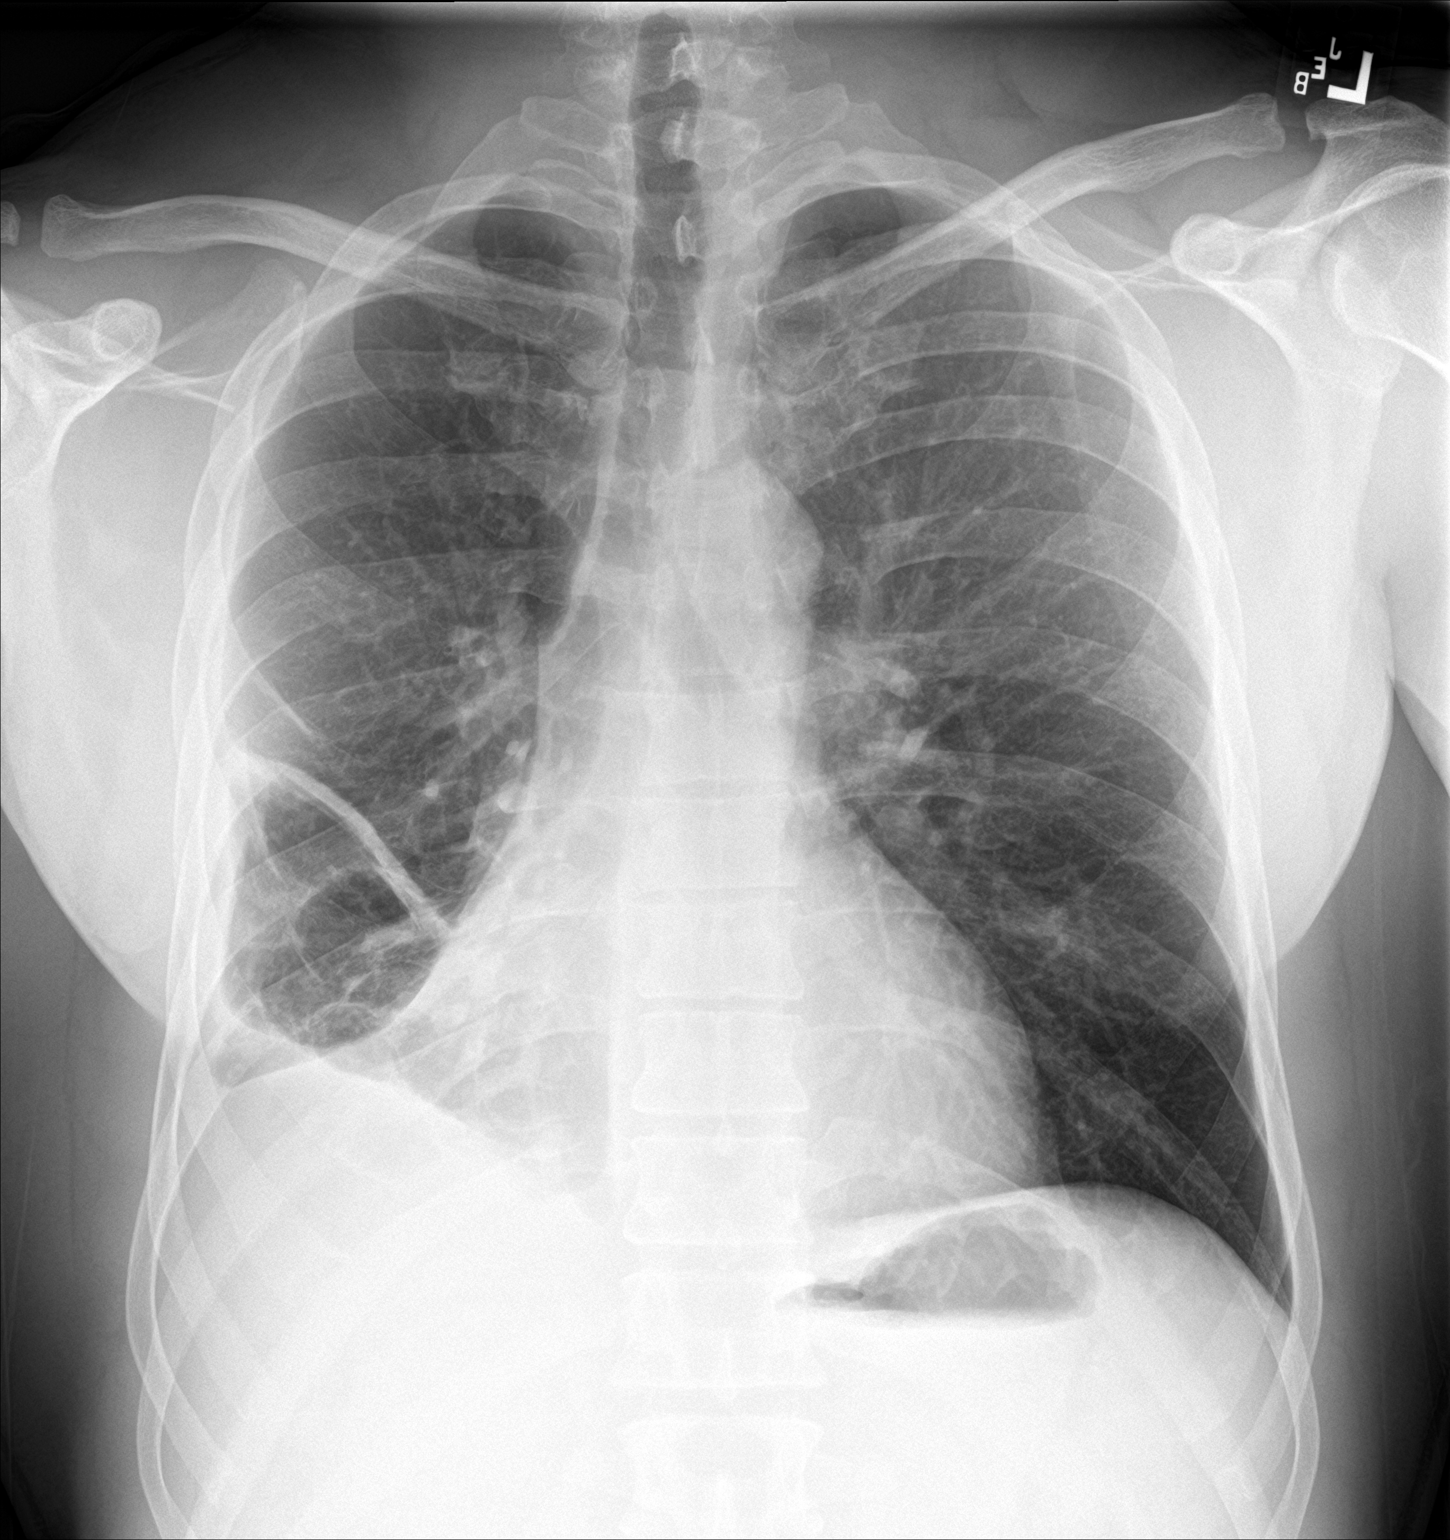

[1 of 1 positions shown; findings below may reference images not displayed]

FINDINGS: Heart size is normal. Moderate right pleural effusion remains. Fluid
is present in minor fissure. Right basilar airspace disease likely
reflects atelectasis. No pneumothorax is present. Left lung is clear
axial skeleton within limits.
IMPRESSION: 1. Moderate residual right pleural effusion.
2. No pneumothorax.

## 2022-10-02 IMAGING — US IR THORACENTESIS ASP PLEURAL SPACE W/IMG GUIDE
1 series · 3 of 3 positions shown · non-contrast
Comparison: none

INDICATION: History of asthma with increasing shortness of breath. Right pleural
effusion of unknown etiology. Request for diagnostic and therapeutic
thoracentesis.

[Series 1: ir (id) (id)/(id)/(id) ir · 3 of 3 slices shown]
[im 1/3]
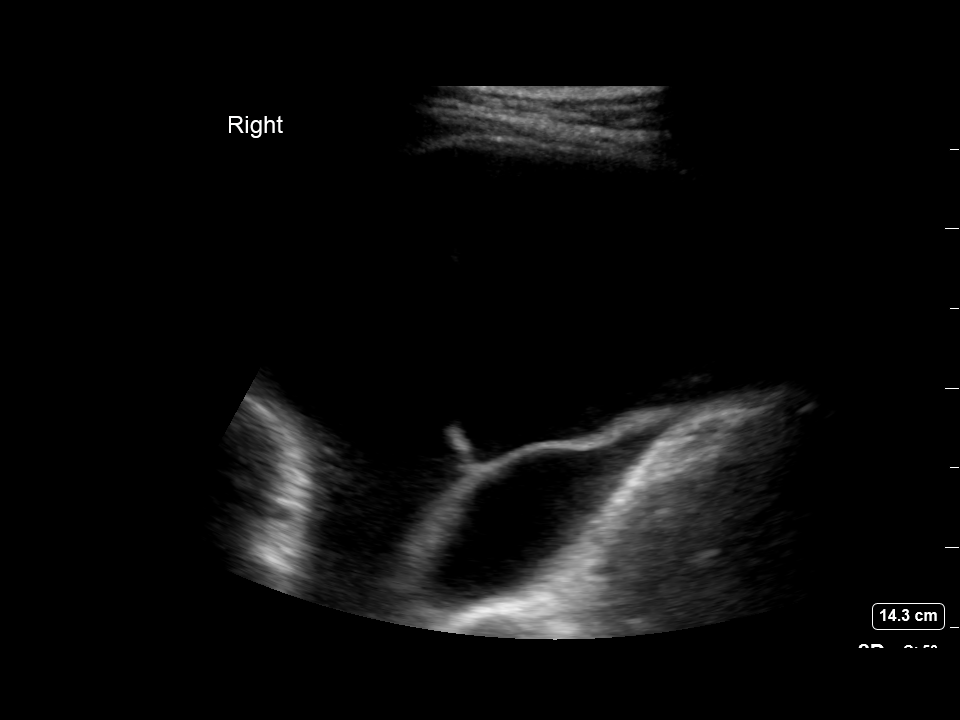
[im 2/3]
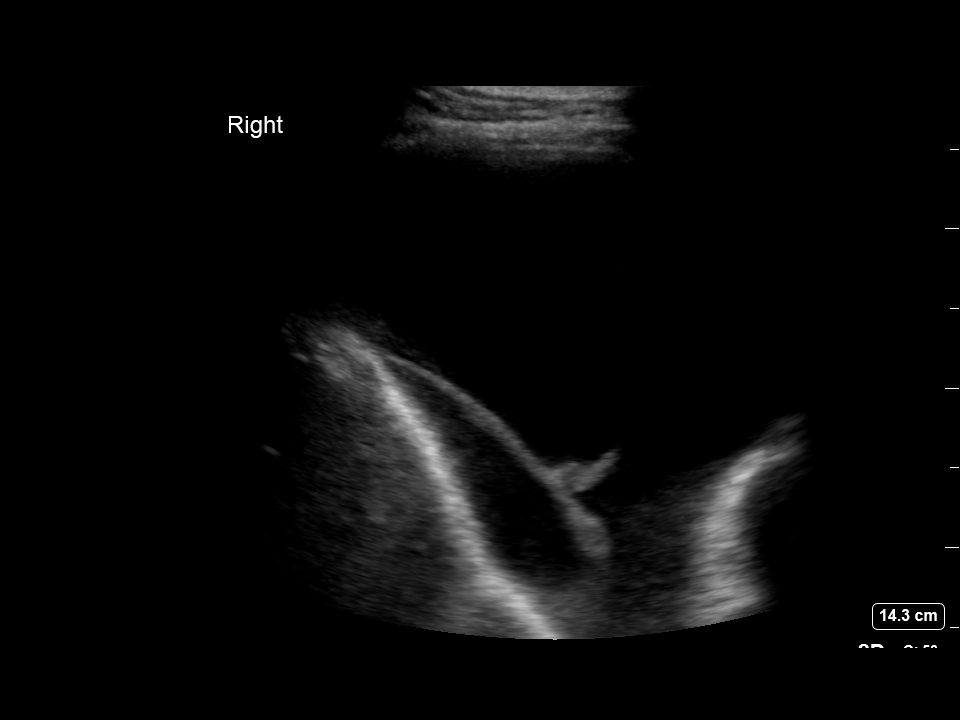
[im 3/3]
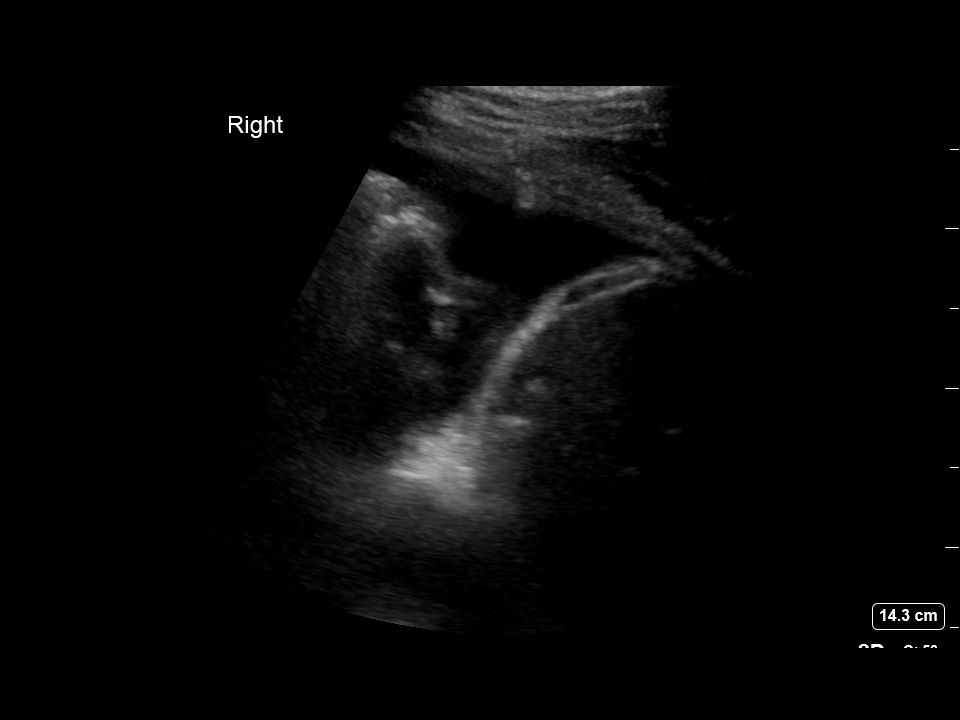

[3 of 3 positions shown; findings below may reference images not displayed]

EXAM:
ULTRASOUND GUIDED RIGHT THORACENTESIS

MEDICATIONS:
1% lidocaine 10 mL

COMPLICATIONS:
None immediate.

PROCEDURE:
An ultrasound guided thoracentesis was thoroughly discussed with the
patient and questions answered. The benefits, risks, alternatives
and complications were also discussed. The patient understands and
wishes to proceed with the procedure. Written consent was obtained.

Ultrasound was performed to localize and mark an adequate pocket of
fluid in the right chest. The area was then prepped and draped in
the normal sterile fashion. 1% Lidocaine was used for local
anesthesia. Under ultrasound guidance a 6 Fr Safe-T-Centesis
catheter was introduced. Thoracentesis was performed. The catheter
was removed and a dressing applied.
FINDINGS: A total of approximately 2 L of dark red fluid was removed. Samples
were sent to the laboratory as requested by the clinical team.
IMPRESSION: Successful ultrasound guided right thoracentesis yielding 2 L of
pleural fluid.

No pneumothorax on post-procedure chest x-ray.
